# Patient Record
Sex: Male | Born: 1985 | Race: White | Hispanic: No | Marital: Married | Smoking: Never smoker
Health system: Southern US, Community
[De-identification: ages and names within clinical notes are randomized; demographics above are authoritative.]

## PROBLEM LIST (undated history)

## (undated) DIAGNOSIS — M1990 Unspecified osteoarthritis, unspecified site: Secondary | ICD-10-CM

## (undated) HISTORY — PX: WISDOM TOOTH EXTRACTION: SHX21

---

## 2014-02-24 ENCOUNTER — Emergency Department (HOSPITAL_BASED_OUTPATIENT_CLINIC_OR_DEPARTMENT_OTHER)
Admission: EM | Admit: 2014-02-24 | Discharge: 2014-02-24 | Disposition: A | Discharge status: Home / Self Care | Payer: Worker's Compensation | Attending: Emergency Medicine | Admitting: Emergency Medicine

## 2014-02-24 ENCOUNTER — Encounter (HOSPITAL_BASED_OUTPATIENT_CLINIC_OR_DEPARTMENT_OTHER): Payer: Self-pay | Admitting: Emergency Medicine

## 2014-02-24 ENCOUNTER — Emergency Department (HOSPITAL_BASED_OUTPATIENT_CLINIC_OR_DEPARTMENT_OTHER): Payer: Worker's Compensation

## 2014-02-24 DIAGNOSIS — Y990 Civilian activity done for income or pay: Secondary | ICD-10-CM | POA: Insufficient documentation

## 2014-02-24 DIAGNOSIS — Z8739 Personal history of other diseases of the musculoskeletal system and connective tissue: Secondary | ICD-10-CM | POA: Insufficient documentation

## 2014-02-24 DIAGNOSIS — Z79899 Other long term (current) drug therapy: Secondary | ICD-10-CM | POA: Diagnosis not present

## 2014-02-24 DIAGNOSIS — S0230XA Fracture of orbital floor, unspecified side, initial encounter for closed fracture: Secondary | ICD-10-CM | POA: Insufficient documentation

## 2014-02-24 DIAGNOSIS — Y9289 Other specified places as the place of occurrence of the external cause: Secondary | ICD-10-CM | POA: Diagnosis not present

## 2014-02-24 DIAGNOSIS — W1809XA Striking against other object with subsequent fall, initial encounter: Secondary | ICD-10-CM | POA: Insufficient documentation

## 2014-02-24 DIAGNOSIS — Y9389 Activity, other specified: Secondary | ICD-10-CM | POA: Diagnosis not present

## 2014-02-24 DIAGNOSIS — R200 Anesthesia of skin: Secondary | ICD-10-CM

## 2014-02-24 DIAGNOSIS — S0231XA Fracture of orbital floor, right side, initial encounter for closed fracture: Secondary | ICD-10-CM

## 2014-02-24 DIAGNOSIS — S0590XA Unspecified injury of unspecified eye and orbit, initial encounter: Secondary | ICD-10-CM | POA: Diagnosis present

## 2014-02-24 HISTORY — DX: Unspecified osteoarthritis, unspecified site: M19.90

## 2014-02-24 MED ORDER — HYDROCODONE-ACETAMINOPHEN 5-325 MG PO TABS: 1.0000 | ORAL_TABLET | Freq: Four times a day (QID) | ORAL | Status: AC | PRN

## 2014-02-24 NOTE — ED Notes (Signed)
Patient transported to CT 

## 2014-02-24 NOTE — Discharge Instructions (Signed)
Facial Fracture A facial fracture is a break in one of the bones of your face. HOME CARE INSTRUCTIONS   Protect the injured part of your face until it is healed.  Do not participate in activities which give chance for re-injury until your doctor approves.  Gently wash and dry your face.  Wear head and facial protection while riding a bicycle, motorcycle, or snowmobile. SEEK MEDICAL CARE IF:   An oral temperature above 102 F (38.9 C) develops.  You have severe headaches or notice changes in your vision.  You have new numbness or tingling in your face.  You develop nausea (feeling sick to your stomach), vomiting or a stiff neck. SEEK IMMEDIATE MEDICAL CARE IF:   You develop difficulty seeing or experience double vision.  You become dizzy, lightheaded, or faint.  You develop trouble speaking, breathing, or swallowing.  You have a watery discharge from your nose or ear. MAKE SURE YOU:   Understand these instructions.  Will watch your condition.  Will get help right away if you are not doing well or get worse. Document Released: 11/18/2005 Document Revised: 02/10/2012 Document Reviewed: 07/07/2008 [facility] Patient Information 2014 [place], [place].  Orbital Floor Fracture, Non-Blowout The eye sits in the part of the skull called the "orbit." The upper and outside walls of the orbit are thick and strong. The inside wall (near the nose) and the orbital floor are very thin and weak. The tissues around the eye will briefly press together if there is a direct blow to the front of the eye. This leads to high pressure against the orbital walls. The inside wall and the orbital floor may break since these are the weakest walls. If the orbital floor breaks, the tissues around the eye, including the muscle that makes the eye look down, may become trapped in the sinus below when the orbital floor "blows out." If a blowout does not happen, the orbital floor fracture is considered a non-blowout  orbital fracture. CAUSES An orbital floor fracture can be caused by any accident in which an object hits the face or the face strikes against a hard object. The most common ways that people break their eye socket include:  Being hit by a blunt object, such as a baseball bat or a fist.  Striking the face on the car dashboard during a crash.  Falls.  Gunshot. SYMPTOMS  If there has been no injury to the eye itself, symptoms may include:  Puffiness (swelling) and bruising around the eye area (black eye).  Numbness of the cheek and upper gum on the side with the floor fracture. This is caused by nerve injury to these areas.  Pain around the eye.  Headache.  Ear pain on the injured side. DIAGNOSIS The diagnosis of an orbital floor fracture is suspected during an eye exam by an ophthalmologist. It is confirmed by X rays or CT scan. TREATMENT Your caregiver may suggest waiting 1 or 2 weeks for the swelling to go away before examining the eye. When the swelling lessens, your caregiver will examine the eye to see if there is any sign of a trapped muscle or double vision when looking in different directions. If double vision is not found and muscle or tissue did not get trapped, no further treatment is necessary. After that, in almost all cases, the bones heal together on their own.  HOME CARE INSTRUCTIONS  Take all pain medicine as directed by your caregiver.  Use ice packs or other cold therapy to  reduce swelling as directed by your caregiver.  Do not put a contact lens in the injured eye until your caregiver approves.  Avoid dusty environments.  Always wear protective glasses or goggles when recommended. Wearing protective eyewear is not dangerous to your injured eye and will not delay healing.  As long as your other eye is seeing normally, you may return to work and drive.  You may travel by plane or be in high altitudes. However, your swelling may take longer to go away, and you  may have sinus pain.  Be aware that your depth perception and your ability to judge distance may be reduced or lost. SEEK IMMEDIATE MEDICAL CARE IF:  Your vision changes.  Your redness or swelling persists around the injured eye or gets worse.  You start to have double vision.  You have a bloody or discolored discharge from your nose.  You have a fever that lasts longer than 2 to 3 days.  You have a fever that suddenly gets worse.  Your cheek or upper gum numbness does not go away. MAKE SURE YOU:  Understand these instructions.  Will watch your condition.  Will get help right away if you are not doing well or get worse. Document Released: 02/10/2012 Document Reviewed: 02/10/2012 [facility] Patient Information 2014 [place], [place].  Paresthesia Paresthesia is a burning or prickling feeling. This feeling can happen in any part of the body. It often happens in the hands, arms, legs, or feet. HOME CARE  Avoid drinking alcohol.  Try massage or needle therapy (acupuncture) to help with your problems.  Keep all doctor visits as told. GET HELP RIGHT AWAY IF:   You feel weak.  You have trouble walking or moving.  You have problems speaking or seeing.  You feel confused.  You cannot control when you poop (bowel movement) or pee (urinate).  You lose feeling (numbness) after an injury.  You pass out (faint).  Your burning or prickling feeling gets worse when you walk.  You have pain, cramps, or feel dizzy.  You have a rash. MAKE SURE YOU:   Understand these instructions.  Will watch your condition.  Will get help right away if you are not doing well or get worse. Document Released: 10/31/2008 Document Revised: 02/10/2012 Document Reviewed: 08/09/2011 [facility] Patient Information 2014 [place], [place].

## 2014-02-24 NOTE — ED Provider Notes (Signed)
CSN: [id number]     Arrival date & time 02/24/14  1024 History   First MD Initiated Contact with Patient 02/24/14 1058     Chief Complaint  Patient presents with  . Fall  . Eye Injury     (Consider location/radiation/quality/duration/timing/severity/associated sxs/prior Treatment) HPI Comments: Hit R face on throttle levers of an airplane. Double vision instantly for 30 minutes, improved to only when looking down. No blurred or decreased vision. R face numbness in R cheek, R upper lip, R upper teeth and gums.  Patient is a 28 y.o. male presenting with fall and eye injury. The history is provided by the patient.  Fall This is a new problem. The current episode started 6 to 12 hours ago. Episode frequency: once. The problem has been resolved. Pertinent negatives include no chest pain, no abdominal pain and no shortness of breath. Nothing aggravates the symptoms. Nothing relieves the symptoms.  Eye Injury Pertinent negatives include no chest pain, no abdominal pain and no shortness of breath.    Past Medical History  Diagnosis Date  . Arthritis    Past Surgical History  Procedure Laterality Date  . Wisdom tooth extraction     No family history on file. History  Substance Use Topics  . Smoking status: Never Smoker   . Smokeless tobacco: Not on file  . Alcohol Use: Not on file     Comment: occasional    Review of Systems  Constitutional: Negative for fever and chills.  Eyes: Positive for visual disturbance (double vision when looking down).  Respiratory: Negative for cough and shortness of breath.   Cardiovascular: Negative for chest pain.  Gastrointestinal: Negative for abdominal pain.  All other systems reviewed and are negative.      Allergies  Review of patient's allergies indicates no known allergies.  Home Medications   Current Outpatient Rx  Name  Route  Sig  Dispense  Refill  . albuterol (PROVENTIL HFA;VENTOLIN HFA) 108 (90 BASE) MCG/ACT inhaler  Inhalation   Inhale into the lungs every 6 (six) hours as needed for wheezing or shortness of breath.         Marland Kitchen. albuterol (PROVENTIL, VENTOLIN) (5 MG/ML) 0.5% NEBU   Nebulization   Take by nebulization continuous.          BP 161/80  Pulse 74  Temp(Src) 98.1 F (36.7 C) (Oral)  Resp 18  Ht 5\' 10"  (1.778 m)  Wt 280 lb (127.007 kg)  BMI 40.18 kg/m2  SpO2 100% Physical Exam  Constitutional: He is oriented to person, place, and time. He appears well-developed and well-nourished. No distress.  HENT:  Head: Normocephalic.  Nose:    Mouth/Throat: No oropharyngeal exudate.  Eyes: EOM are normal. Pupils are equal, round, and reactive to light. Right eye exhibits normal extraocular motion. Left eye exhibits normal extraocular motion. Right pupil is reactive. Left pupil is reactive.  R orbital swelling and tenderness laterally. Mild abrasion.  Neck: Normal range of motion. Neck supple.  Cardiovascular: Normal rate and regular rhythm.  Exam reveals no friction rub.   No murmur heard. Pulmonary/Chest: Effort normal and breath sounds normal. No respiratory distress. He has no wheezes. He has no rales.  Abdominal: He exhibits no distension. There is no tenderness. There is no rebound.  Musculoskeletal: He exhibits no edema.       Left hand: He exhibits decreased range of motion (L middle finger, middle phalanx swelling) and swelling. He exhibits normal capillary refill, no deformity and no laceration. Normal  sensation noted. Decreased strength noted.  Neurological: He is alert and oriented to person, place, and time.  Skin: He is not diaphoretic.    ED Course  Procedures (including critical care time) Labs Review Labs Reviewed - No data to display Imaging Review Dg Finger Middle Left  02/24/2014   CLINICAL DATA:  Injury to the third digit  EXAM: LEFT MIDDLE FINGER 2+V  COMPARISON:  None.  FINDINGS: There is no evidence of fracture or dislocation. There is no evidence of arthropathy  or other focal bone abnormality. Soft tissues are unremarkable.  IMPRESSION: No acute abnormality noted.   Electronically Signed   By: Alcide Clever M.D.   On: 02/24/2014 11:50   Ct Maxillofacial Wo Cm  02/24/2014   CLINICAL DATA:  Patient fell last night. Double vision when looking down.  EXAM: CT MAXILLOFACIAL WITHOUT CONTRAST  TECHNIQUE: Multidetector CT imaging of the maxillofacial structures was performed. Multiplanar CT image reconstructions were also generated. A small metallic BB was placed on the right temple in order to reliably differentiate right from left.  COMPARISON:  None.  FINDINGS: There is right periorbital soft tissue swelling. The globes are intact. The orbital walls are intact. There is a mildly displaced right orbital floor fracture. There is no inferior herniation of the inferior rectus muscle. The maxilla is intact. The mandible is intact. The zygomatic arches are intact. There is leftward deviation of the nasal septum. There is a nondisplaced right nasal bone fracture. The temporomandibular joints are normal.  There is high-density material in the right maxillary sinus likely representing blood products. The visualized portions of the mastoid sinuses are well aerated.  IMPRESSION: 1. Right orbital floor fracture without herniation of the inferior rectus muscle. 2. Nondisplaced right nasal bone fracture.   Electronically Signed   By: Elige Ko   On: 02/24/2014 11:49     EKG Interpretation None      MDM   Final diagnoses:  Fracture of orbital floor, blow-out, right, closed  Right facial numbness    71M presents with facial injury and finger injury. 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 A splinted onto the swallow c to his right eye socket. No blurry vision, however had initially double vision the first 30 minutes. Wife noted his right eye was tracking slowing of the left. He is now having double vision when he looks down. His vision is normal 20/20 today. He's also  having right facial nerve and numbness on his right cheek and right upper lip and teeth and gums of the right maxilla. We'll scan his face to look for possible orbital injury. Eye itself looks well. He also injured his left middle finger in the fall. He thinks he jammed it. Left middle finger with swelling around the PIP joint, NVI. Will xray. CT with orbital floor fx w/o herniation. Dr. Gwen Pounds w/ Ophtho states nothing to do urgently, will need ENT f/u for his inferior orbital nerve entrapment and will need Ophtho eval if needing to go to surgery. Dr. Pollyann Kennedy with ENT will f/u patient in the next few days. Given ENT and Ophtho f/u. Stable for discharge.  Dagmar Hait, MD 02/24/14 1242

## 2014-02-24 NOTE — ED Notes (Signed)
Right eye injury after he fell.  He reports leaning into the cockpit of an airplane, a cushion slipped, he lost his balance strikig the right side of his face on an object.  Swelling and abrasion noted to periorbital area and states he has double vision when looking down.

## 2014-12-09 IMAGING — CT CT MAXILLOFACIAL W/O CM
3 series · 16 of 47 positions shown, 19 images · non-contrast
Comparison: None.

CLINICAL DATA: Patient fell last night. Double vision when looking
down.

EXAM:
CT MAXILLOFACIAL WITHOUT CONTRAST
TECHNIQUE: Multidetector CT imaging of the maxillofacial structures was
performed. Multiplanar CT image reconstructions were also generated.
A small metallic BB was placed on the right temple in order to
reliably differentiate right from left.

[Series 3: maxillofacial 2.0 h30s st · axial · 0.38mm/px · z∈[+132,+264]mm · 10 of 78 slices shown, 13 images]
[im 6/78  brain]
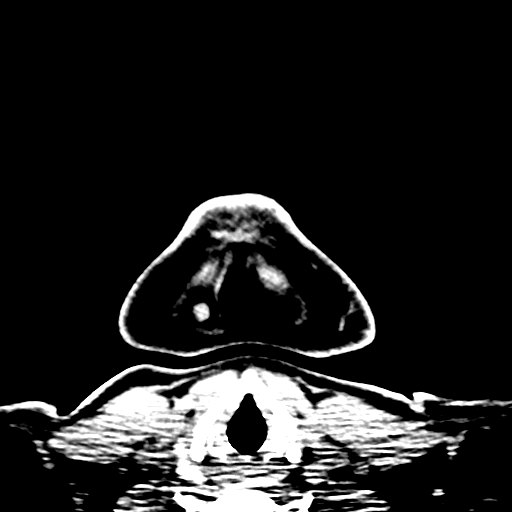
[im 6/78  bone]
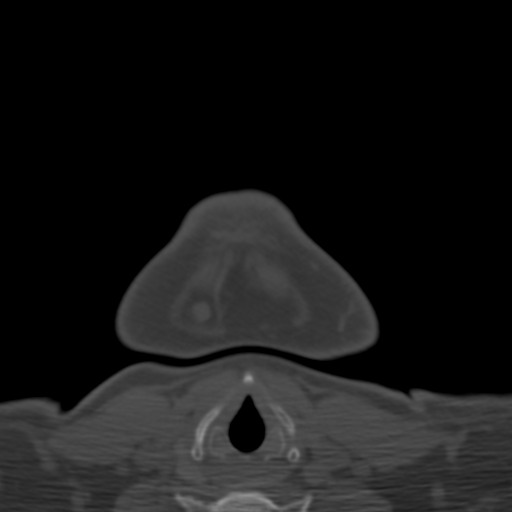
[im 14/78  bone]
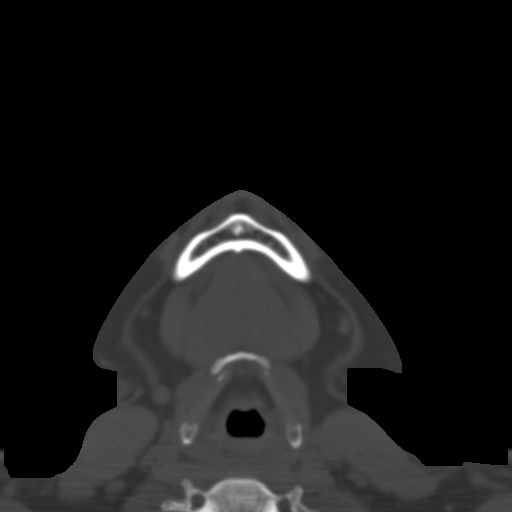
[im 22/78  bone]
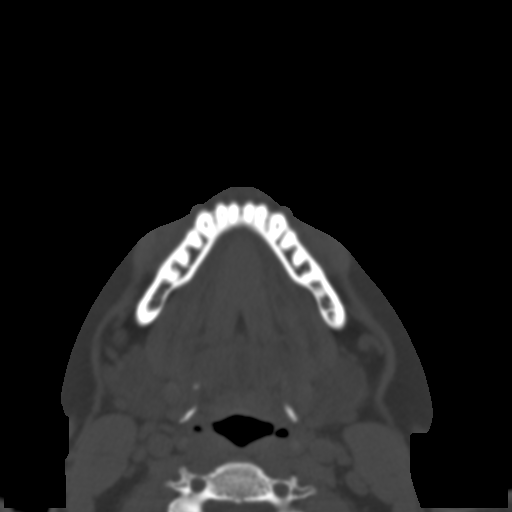
[im 27/78  bone]
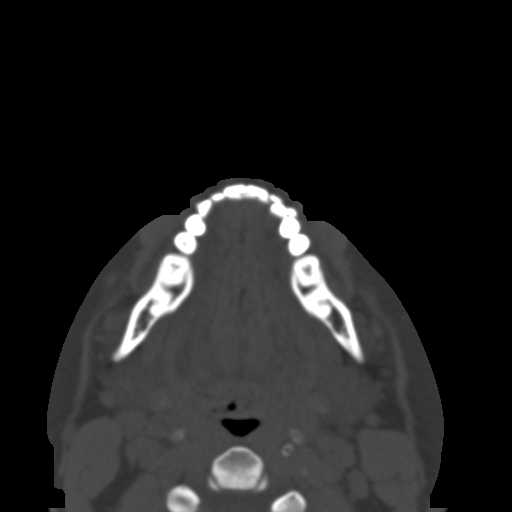
[im 35/78  brain]
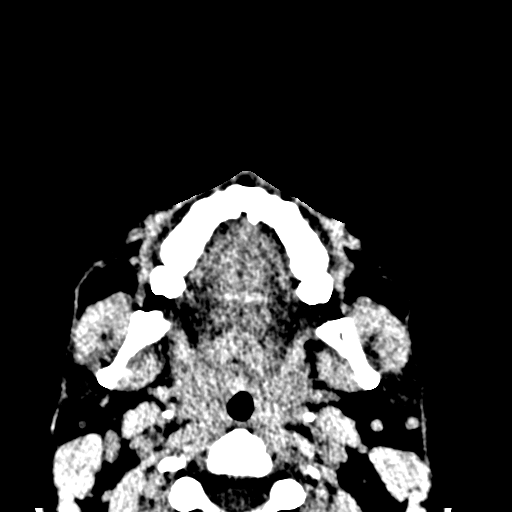
[im 35/78  bone]
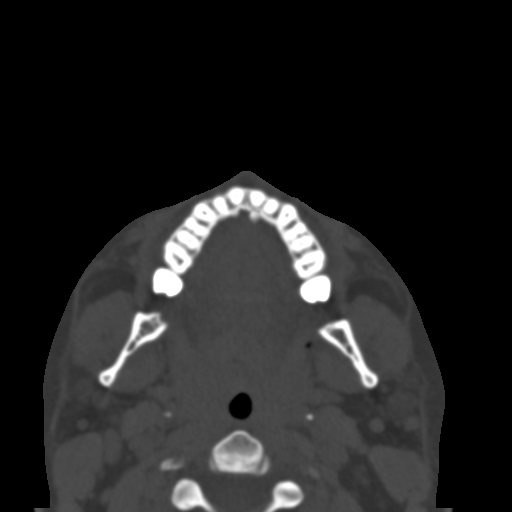
[im 43/78  bone]
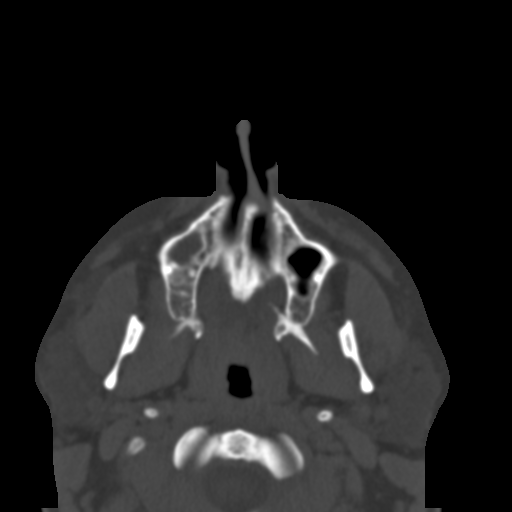
[im 51/78  bone]
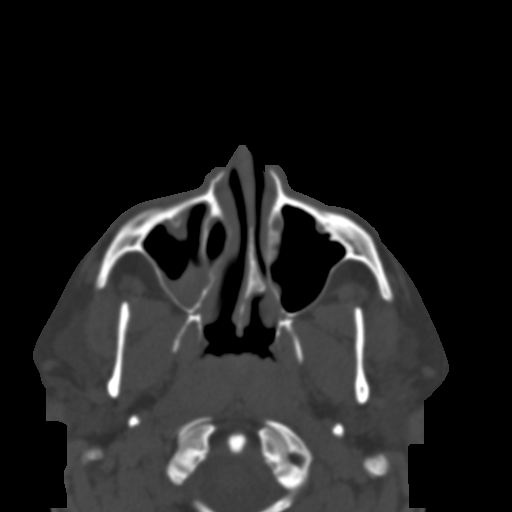
[im 59/78  bone]
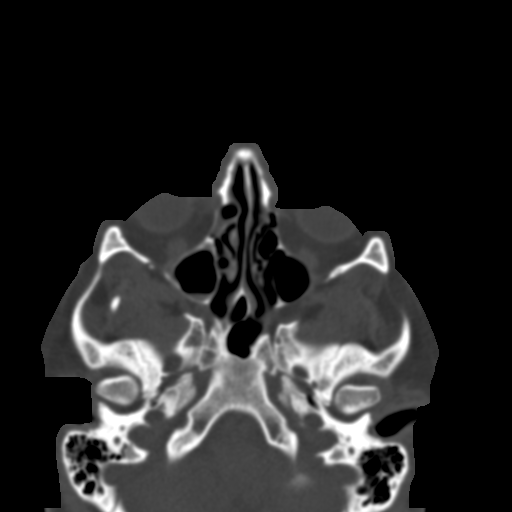
[im 64/78  brain]
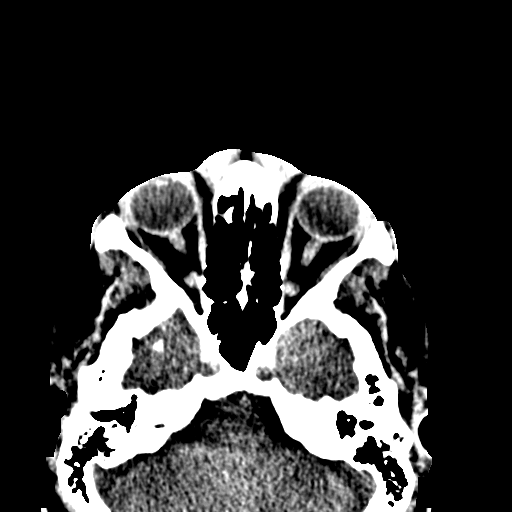
[im 64/78  bone]
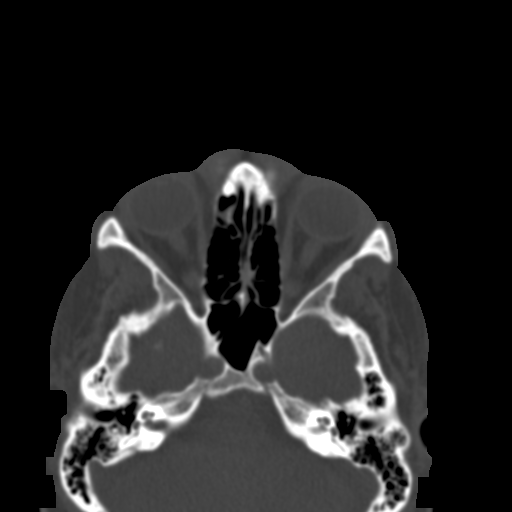
[im 72/78  bone]
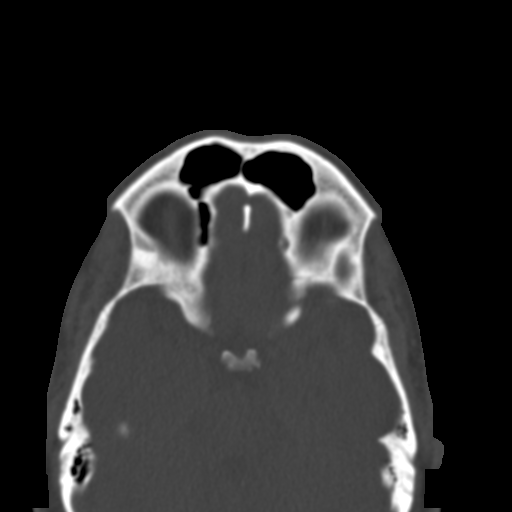

[Series 8: maxillofacial 2.0 coronal · coronal · 0.32mm/px · 3 of 72 slices shown]
[im 24/72  bone]
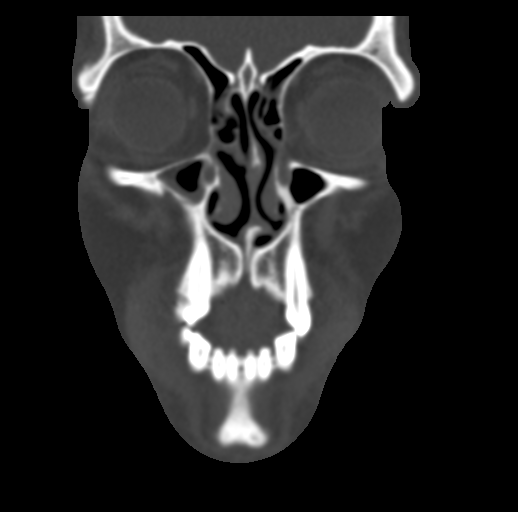
[im 32/72  bone]
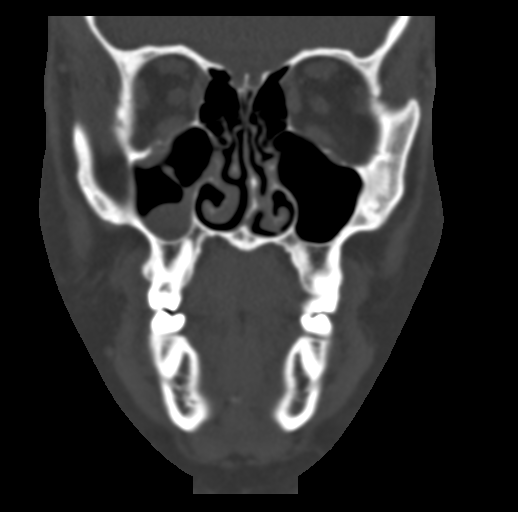
[im 40/72  bone]
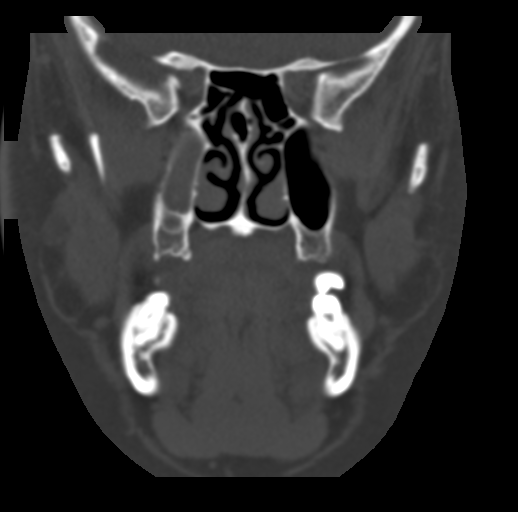

[Series 9: maxillofacial 2.0 sagittal · sagittal · 0.33mm/px · 3 of 85 slices shown]
[im 29/85  bone]
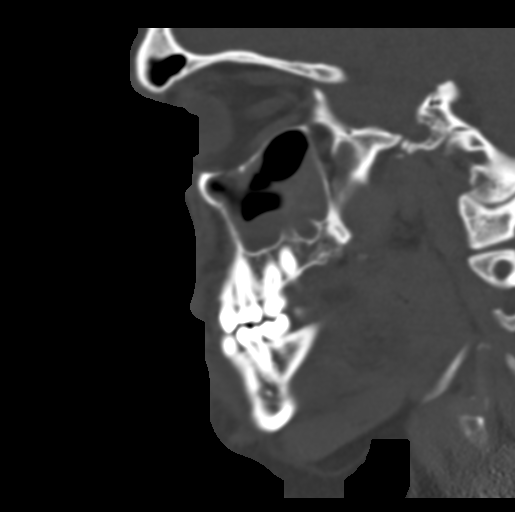
[im 43/85  bone]
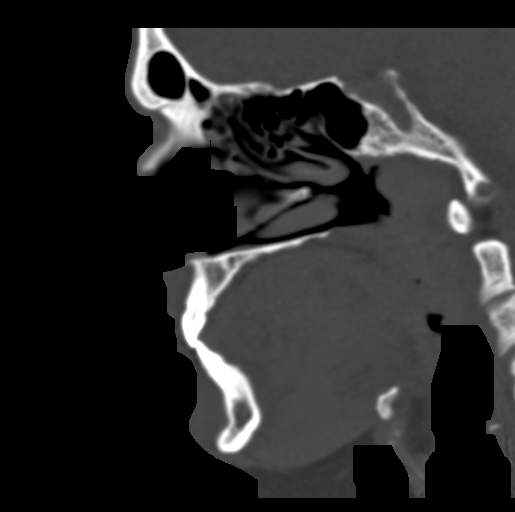
[im 57/85  bone]
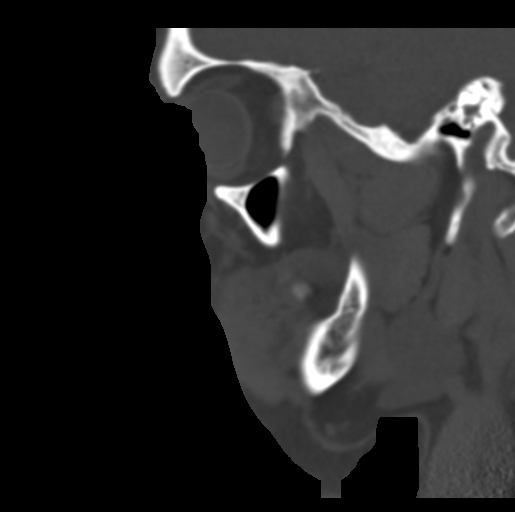

[16 of 47 positions shown; findings below may reference images not displayed]

FINDINGS: There is right periorbital soft tissue swelling. The globes are
intact. The orbital walls are intact. There is a mildly displaced
right orbital floor fracture. There is no inferior herniation of the
inferior rectus muscle. The maxilla is intact. The mandible is
intact. The zygomatic arches are intact. There is leftward deviation
of the nasal septum. There is a nondisplaced right nasal bone
fracture. The temporomandibular joints are normal.

There is high-density material in the right maxillary sinus likely
representing blood products. The visualized portions of the mastoid
sinuses are well aerated.
IMPRESSION: 1. Right orbital floor fracture without herniation of the inferior
rectus muscle.
2. Nondisplaced right nasal bone fracture.
